# Patient Record
Sex: Female | Born: 1970 | Race: White | Hispanic: No | Marital: Married | Smoking: Never smoker
Health system: Southern US, Community
[De-identification: ages and names within clinical notes are randomized; demographics above are authoritative.]

## PROBLEM LIST (undated history)

## (undated) DIAGNOSIS — F988 Other specified behavioral and emotional disorders with onset usually occurring in childhood and adolescence: Secondary | ICD-10-CM

## (undated) HISTORY — PX: KNEE CARTILAGE SURGERY: SHX688

## (undated) HISTORY — PX: TONSILLECTOMY: SUR1361

---

## 2014-10-24 ENCOUNTER — Emergency Department: Payer: BLUE CROSS/BLUE SHIELD

## 2014-10-24 ENCOUNTER — Emergency Department
Admission: EM | Admit: 2014-10-24 | Discharge: 2014-10-24 | Disposition: A | Discharge status: Home / Self Care | Payer: BLUE CROSS/BLUE SHIELD | Attending: Emergency Medicine | Admitting: Emergency Medicine

## 2014-10-24 DIAGNOSIS — Y9389 Activity, other specified: Secondary | ICD-10-CM | POA: Insufficient documentation

## 2014-10-24 DIAGNOSIS — S3992XA Unspecified injury of lower back, initial encounter: Secondary | ICD-10-CM | POA: Diagnosis not present

## 2014-10-24 DIAGNOSIS — S60512A Abrasion of left hand, initial encounter: Secondary | ICD-10-CM | POA: Diagnosis not present

## 2014-10-24 DIAGNOSIS — T148 Other injury of unspecified body region: Secondary | ICD-10-CM | POA: Insufficient documentation

## 2014-10-24 DIAGNOSIS — T148XXA Other injury of unspecified body region, initial encounter: Secondary | ICD-10-CM

## 2014-10-24 DIAGNOSIS — S60222A Contusion of left hand, initial encounter: Secondary | ICD-10-CM | POA: Diagnosis not present

## 2014-10-24 DIAGNOSIS — Y92410 Unspecified street and highway as the place of occurrence of the external cause: Secondary | ICD-10-CM | POA: Diagnosis not present

## 2014-10-24 DIAGNOSIS — S61412A Laceration without foreign body of left hand, initial encounter: Secondary | ICD-10-CM | POA: Diagnosis not present

## 2014-10-24 DIAGNOSIS — Y998 Other external cause status: Secondary | ICD-10-CM | POA: Diagnosis not present

## 2014-10-24 DIAGNOSIS — S79912A Unspecified injury of left hip, initial encounter: Secondary | ICD-10-CM | POA: Diagnosis not present

## 2014-10-24 DIAGNOSIS — S4992XA Unspecified injury of left shoulder and upper arm, initial encounter: Secondary | ICD-10-CM | POA: Diagnosis not present

## 2014-10-24 DIAGNOSIS — S6992XA Unspecified injury of left wrist, hand and finger(s), initial encounter: Secondary | ICD-10-CM | POA: Diagnosis present

## 2014-10-24 HISTORY — DX: Other specified behavioral and emotional disorders with onset usually occurring in childhood and adolescence: F98.8

## 2014-10-24 MED ORDER — OXYCODONE HCL 5 MG PO TABS
5.0000 mg | ORAL_TABLET | Freq: Once | ORAL | Status: AC
  Administered 2014-10-24: 5 mg via ORAL
  Filled 2014-10-24: qty 1

## 2014-10-24 MED ORDER — IBUPROFEN 800 MG PO TABS: 800.0000 mg | ORAL_TABLET | Freq: Three times a day (TID) | ORAL | Status: DC | PRN

## 2014-10-24 MED ORDER — DIAZEPAM 5 MG PO TABS: 5.0000 mg | ORAL_TABLET | Freq: Three times a day (TID) | ORAL | Status: DC | PRN

## 2014-10-24 MED ORDER — DIAZEPAM 5 MG PO TABS
10.0000 mg | ORAL_TABLET | Freq: Once | ORAL | Status: AC
  Administered 2014-10-24: 10 mg via ORAL
  Filled 2014-10-24: qty 2
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
## 2019-01-29 ENCOUNTER — Ambulatory Visit
Admission: EM | Admit: 2019-01-29 | Discharge: 2019-01-29 | Disposition: A | Discharge status: Home / Self Care | Payer: BC Managed Care – PPO | Attending: Family Medicine | Admitting: Family Medicine

## 2019-01-29 ENCOUNTER — Ambulatory Visit (INDEPENDENT_AMBULATORY_CARE_PROVIDER_SITE_OTHER): Payer: BC Managed Care – PPO

## 2019-01-29 ENCOUNTER — Encounter: Payer: Self-pay | Admitting: Gynecology

## 2019-01-29 ENCOUNTER — Other Ambulatory Visit: Payer: Self-pay

## 2019-01-29 DIAGNOSIS — S99922A Unspecified injury of left foot, initial encounter: Secondary | ICD-10-CM

## 2019-01-29 DIAGNOSIS — W228XXA Striking against or struck by other objects, initial encounter: Secondary | ICD-10-CM | POA: Diagnosis not present

## 2019-01-29 DIAGNOSIS — M79672 Pain in left foot: Secondary | ICD-10-CM

## 2019-01-29 MED ORDER — MELOXICAM 15 MG PO TABS: 15.0000 mg | ORAL_TABLET | Freq: Every day | ORAL | 0 refills | Status: AC | PRN

## 2019-01-29 NOTE — ED Provider Notes (Signed)
[facility]    CSN: [id number] Arrival date & time: 01/29/19  1245  History   Chief Complaint Chief Complaint  Patient presents with  . Foot Injury   HPI  48 year old female presents with a left foot injury.  Patient reports that she injured her left foot last night.  She was going down the stairs with socks on.  She slipped and subsequently struck her foot on the edge of a step.  She states that she injured the foot at the arch.  She reports moderate pain, 6/10 in severity.  She currently has it wrapped.  She states it is tender to the touch.  No relieving factors.  She is concerned about potential fracture.  No other associated symptoms.  No other complaints.  PMH, Surgical Hx, Family Hx, Social History reviewed and updated as below.  Past Medical History:  Diagnosis Date  . ADD (attention deficit disorder)    Past Surgical History:  Procedure Laterality Date  . KNEE CARTILAGE SURGERY    . TONSILLECTOMY      OB History   No obstetric history on file.    Home Medications    Prior to Admission medications   Medication Sig Start Date End Date Taking? Authorizing Provider  fluticasone (FLONASE) 50 MCG/ACT nasal spray 2 sprays by Each Nare route daily. 04/15/13  Yes [provider]  lisdexamfetamine (VYVANSE) 50 MG capsule Take 50 mg by mouth daily.   Yes [provider]  meloxicam (MOBIC) 15 MG tablet Take 1 tablet (15 mg total) by mouth daily as needed. 01/29/19   Kion Huntsberry, Verdis Frederickson, DO  Omega-3 1000 MG CAPS Take by mouth.    [provider]  amphetamine-dextroamphetamine (ADDERALL XR) 20 MG 24 hr capsule Take 20 mg by mouth daily.  01/29/19  [provider]    Family History Family History  Problem Relation Age of Onset  . Migraines Mother   . Thyroid disease Mother   . Ovarian cancer Mother     Social History Social History   Tobacco Use  . Smoking status: Never Smoker  . Smokeless tobacco: Never Used  Substance  Use Topics  . Alcohol use: Yes    Comment: rarely  . Drug use: No     Allergies   Lactase, Sulfa antibiotics, and Nickel   Review of Systems Review of Systems  Constitutional: Negative.   Musculoskeletal:       Left foot injury.   Physical Exam Triage Vital Signs ED Triage Vitals  Enc Vitals Group     BP 01/29/19 1306 116/65     Pulse Rate 01/29/19 1306 76     Resp 01/29/19 1306 16     Temp 01/29/19 1306 98.3 F (36.8 C)     Temp Source 01/29/19 1306 Oral     SpO2 01/29/19 1306 99 %     Weight 01/29/19 1300 185 lb (83.9 kg)     Height 01/29/19 1300 5\' 3"  (1.6 m)     Head Circumference --      Peak Flow --      Pain Score 01/29/19 1259 6     Pain Loc --      Pain Edu? --      Excl. in GC? --    Updated Vital Signs BP 116/65 (BP Location: Right Arm)   Pulse 76   Temp 98.3 F (36.8 C) (Oral)   Resp 16   Ht 5\' 3"  (1.6 m)   Wt 83.9 kg  LMP 01/29/2019   SpO2 99%   BMI 32.77 kg/m   Visual Acuity Right Eye Distance:   Left Eye Distance:   Bilateral Distance:    Right Eye Near:   Left Eye Near:    Bilateral Near:     Physical Exam Vitals and nursing note reviewed.  Constitutional:      General: She is not in acute distress.    Appearance: Normal appearance. She is not ill-appearing.  HENT:     Head: Normocephalic and atraumatic.  Eyes:     General:        Right eye: No discharge.        Left eye: No discharge.     Conjunctiva/sclera: Conjunctivae normal.  Cardiovascular:     Rate and Rhythm: Normal rate and regular rhythm.     Heart sounds: No murmur.  Pulmonary:     Effort: Pulmonary effort is normal.     Breath sounds: Normal breath sounds. No wheezing, rhonchi or rales.  Musculoskeletal:     Comments: Left foot -tenderness over the medial longitudinal arch.  No appreciable bruising.  No other discrete areas of tenderness.  Neurological:     Mental Status: She is alert.  Psychiatric:        Mood and Affect: Mood normal.        Behavior:  Behavior normal.    UC Treatments / Results  Labs (all labs ordered are listed, but only abnormal results are displayed) Labs Reviewed - No data to display  EKG   Radiology DG Foot Complete Left  Result Date: 01/29/2019 CLINICAL DATA:  Slipped on stairs complaining of pain to medial side of foot, difficulty bearing weight. EXAM: LEFT FOOT - COMPLETE 3+ VIEW COMPARISON:  None. FINDINGS: There is no evidence of fracture or dislocation. There is no evidence of arthropathy or other focal bone abnormality. Soft tissues are unremarkable. IMPRESSION: No acute finding in the left foot. Electronically Signed   By: Zetta Bills M.D.   On: 01/29/2019 13:49    Procedures Procedures (including critical care time)  Medications Ordered in UC Medications - No data to display  Initial Impression / Assessment and Plan / UC Course  I have reviewed the triage vital signs and the nursing notes.  Pertinent labs & imaging results that were available during my care of the patient were reviewed by me and considered in my medical decision making (see chart for details).    48 year old female presents with a foot injury.  X-ray negative.  Soft tissue injury.  Meloxicam as directed.  Supportive care.  Final Clinical Impressions(s) / UC Diagnoses   Final diagnoses:  Injury of left foot, initial encounter     Discharge Instructions     Rest, ice, elevation.  Medication as prescribed.  Take care  Dr. Lacinda Axon    ED Prescriptions    Medication Sig Dispense Auth. Provider   meloxicam (MOBIC) 15 MG tablet Take 1 tablet (15 mg total) by mouth daily as needed. 14 tablet Coral Spikes, DO     PDMP not reviewed this encounter.   Coral Spikes, [place] 01/29/19 1406

## 2019-01-29 NOTE — Discharge Instructions (Signed)
Rest, ice, elevation.  Medication as prescribed.  Take care  Dr. Sadira Standard  

## 2019-01-29 NOTE — ED Triage Notes (Signed)
Per patient with left foot injury. Pt. Stated slip while going down the stairs x last night.
